# Patient Record
Sex: Male | Born: 2000 | Race: Black or African American | Hispanic: No | Marital: Single
Health system: Southern US, Community
[De-identification: ages and names within clinical notes are randomized; demographics above are authoritative.]

---

## 2020-01-03 ENCOUNTER — Other Ambulatory Visit: Payer: Self-pay

## 2020-01-03 ENCOUNTER — Emergency Department
Admission: EM | Admit: 2020-01-03 | Discharge: 2020-01-03 | Disposition: A | Discharge status: Home / Self Care | Payer: Medicaid Other | Attending: Emergency Medicine | Admitting: Emergency Medicine

## 2020-01-03 ENCOUNTER — Emergency Department: Payer: Medicaid Other

## 2020-01-03 DIAGNOSIS — Z20822 Contact with and (suspected) exposure to covid-19: Secondary | ICD-10-CM | POA: Insufficient documentation

## 2020-01-03 DIAGNOSIS — J189 Pneumonia, unspecified organism: Secondary | ICD-10-CM

## 2020-01-03 DIAGNOSIS — J181 Lobar pneumonia, unspecified organism: Secondary | ICD-10-CM | POA: Insufficient documentation

## 2020-01-03 DIAGNOSIS — R059 Cough, unspecified: Secondary | ICD-10-CM | POA: Diagnosis present

## 2020-01-03 DIAGNOSIS — R509 Fever, unspecified: Secondary | ICD-10-CM

## 2020-01-03 LAB — RESPIRATORY PANEL BY RT PCR (FLU A&B, COVID)
Influenza A by PCR: NEGATIVE
Influenza B by PCR: NEGATIVE
SARS Coronavirus 2 by RT PCR: NEGATIVE

## 2020-01-03 MED ORDER — HYDROCOD POLST-CPM POLST ER 10-8 MG/5ML PO SUER
5.0000 mL | Freq: Once | ORAL | Status: AC
  Administered 2020-01-03: 5 mL via ORAL
  Filled 2020-01-03: qty 5

## 2020-01-03 MED ORDER — AZITHROMYCIN 500 MG PO TABS
500.0000 mg | ORAL_TABLET | Freq: Once | ORAL | Status: AC
  Administered 2020-01-03: 500 mg via ORAL
  Filled 2020-01-03: qty 1

## 2020-01-03 MED ORDER — AMOXICILLIN 500 MG PO CAPS: 500.0000 mg | ORAL_CAPSULE | Freq: Three times a day (TID) | ORAL | 0 refills | Status: AC

## 2020-01-03 MED ORDER — AMOXICILLIN 500 MG PO CAPS
500.0000 mg | ORAL_CAPSULE | Freq: Once | ORAL | Status: AC
  Administered 2020-01-03: 500 mg via ORAL
  Filled 2020-01-03: qty 1

## 2020-01-03 MED ORDER — ACETAMINOPHEN 325 MG PO TABS
ORAL_TABLET | ORAL | Status: AC
  Filled 2020-01-03: qty 2

## 2020-01-03 MED ORDER — ACETAMINOPHEN 325 MG PO TABS
650.0000 mg | ORAL_TABLET | Freq: Once | ORAL | Status: AC
  Administered 2020-01-03: 650 mg via ORAL

## 2020-01-03 MED ORDER — HYDROCOD POLST-CPM POLST ER 10-8 MG/5ML PO SUER: 5.0000 mL | Freq: Two times a day (BID) | ORAL | 0 refills | Status: AC

## 2020-01-03 MED ORDER — AZITHROMYCIN 250 MG PO TABS: 250.0000 mg | ORAL_TABLET | Freq: Every day | ORAL | 0 refills | Status: AC

## 2020-01-03 NOTE — ED Provider Notes (Signed)
[facility] Emergency Department Provider Note   ____________________________________________   None    (approximate)  I have reviewed the triage vital signs and the nursing notes.   HISTORY  Chief Complaint Cough and Fever    HPI David Downs is a 19 y.o. male who presents to the ED from home with a chief complaint of cough, fever and congestion.  Symptoms of nonproductive cough and congestion x2 weeks; fever started 4 days ago.  Associated with generalized malaise.  Denies chest pain, shortness of breath, abdominal pain, nausea, vomiting or diarrhea.     Past medical history None  There are no problems to display for this patient.   Prior to Admission medications   Medication Sig Start Date End Date Taking? Authorizing Provider  amoxicillin (AMOXIL) 500 MG capsule Take 1 capsule (500 mg total) by mouth 3 (three) times daily. 01/03/20   Irean Hong, MD  azithromycin (ZITHROMAX) 250 MG tablet Take 1 tablet (250 mg total) by mouth daily. 01/03/20   Irean Hong, MD  chlorpheniramine-HYDROcodone (TUSSIONEX PENNKINETIC ER) 10-8 MG/5ML SUER Take 5 mLs by mouth 2 (two) times daily. 01/03/20   Irean Hong, MD    Allergies Patient has no allergy information on record.  No family history on file.  Social History Social History   Tobacco Use  . Smoking status: Not on file  Substance Use Topics  . Alcohol use: Not on file  . Drug use: Not on file    Review of Systems  Constitutional: Positive for fever and malaise Eyes: No visual changes. ENT: Positive for congestion.  No sore throat. Cardiovascular: Denies chest pain. Respiratory: Positive for nonproductive cough.  Denies shortness of breath. Gastrointestinal: No abdominal pain.  No nausea, no vomiting.  No diarrhea.  No constipation. Genitourinary: Negative for dysuria. Musculoskeletal: Negative for back pain. Skin: Negative for rash. Neurological: Negative for headaches, focal weakness  or numbness.   ____________________________________________   PHYSICAL EXAM:  VITAL SIGNS: ED Triage Vitals [01/03/20 0055]  Enc Vitals Group     BP 133/65     Pulse Rate 99     Resp 18     Temp (!) 103.3 F (39.6 C)     Temp Source Oral     SpO2 99 %     Weight 160 lb (72.6 kg)     Height 6\' 1"  (1.854 m)     Head Circumference      Peak Flow      Pain Score 7     Pain Loc      Pain Edu?      Excl. in GC?     Constitutional: Alert and oriented. Well appearing and in mild acute distress. Eyes: Conjunctivae are normal. PERRL. EOMI. Head: Atraumatic. Nose: Congestion/rhinnorhea. Mouth/Throat: Mucous membranes are moist.   Neck: No stridor.   Cardiovascular: Normal rate, regular rhythm. Grossly normal heart sounds.  Good peripheral circulation. Respiratory: Normal respiratory effort.  No retractions. Lungs with slight crackles right lower lobe. Gastrointestinal: Soft and nontender. No distention. No abdominal bruits. No CVA tenderness. Musculoskeletal: No lower extremity tenderness nor edema.  No joint effusions. Neurologic:  Normal speech and language. No gross focal neurologic deficits are appreciated. No gait instability. Skin:  Skin is warm, dry and intact. No rash noted.  No petechiae. Psychiatric: Mood and affect are normal. Speech and behavior are normal.  ____________________________________________   LABS (all labs ordered are listed, but only abnormal results are displayed)  Labs  Reviewed  RESPIRATORY PANEL BY RT PCR (FLU A&B, COVID)   ____________________________________________  EKG  None ____________________________________________  RADIOLOGY I, Maymie Brunke J, personally viewed and evaluated these images (plain radiographs) as part of my medical decision making, as well as reviewing the written report by the radiologist.  ED MD interpretation: RLL pneumonia  Official radiology report(s): DG Chest 2 View  Result Date: 01/03/2020 CLINICAL DATA:   Cough EXAM: CHEST - 2 VIEW COMPARISON:  None. FINDINGS: There is focal consolidation within the posterior basal right lower lobe, likely infectious in the acute setting. Minimal infiltrate may be present within the posterior basal left lower lobe. No pneumothorax or pleural effusion. Cardiac size within normal limits. Pulmonary vascularity is normal. No acute bone abnormality. IMPRESSION: Right lower lobe pneumonia. Electronically Signed   By: Helyn Numbers MD   On: 01/03/2020 01:26    ____________________________________________   PROCEDURES  Procedure(s) performed (including Critical Care):  Procedures   ____________________________________________   INITIAL IMPRESSION / ASSESSMENT AND PLAN / ED COURSE  As part of my medical decision making, I reviewed the following data within the electronic MEDICAL RECORD NUMBER Nursing notes reviewed and incorporated, Labs reviewed, Old chart reviewed (none available), Radiograph reviewed and Notes from prior ED visits (none available)     19 year old otherwise healthy male presenting with fever, cough and malaise.  Respiratory panel negative.  Chest x-ray demonstrates right lower lobe pneumonia.  Will place on amoxicillin, azithromycin and Tussionex as needed for cough.  Repeat oral temperature after Tylenol administration is 99.1 F.  Strict return precautions given.  Patient verbalizes understanding and agrees with plan of care.      ____________________________________________   FINAL CLINICAL IMPRESSION(S) / ED DIAGNOSES  Final diagnoses:  Fever, unspecified fever cause  Community acquired pneumonia of right lower lobe of lung     ED Discharge Orders         Ordered    amoxicillin (AMOXIL) 500 MG capsule  3 times daily        01/03/20 0258    azithromycin (ZITHROMAX) 250 MG tablet  Daily        01/03/20 0258    chlorpheniramine-HYDROcodone (TUSSIONEX PENNKINETIC ER) 10-8 MG/5ML SUER  2 times daily        01/03/20 0258           *Please note:  [name] was evaluated in Emergency Department on 01/03/2020 for the symptoms described in the history of present illness. He was evaluated in the context of the global COVID-19 pandemic, which necessitated consideration that the patient might be at risk for infection with the SARS-CoV-2 virus that causes COVID-19. Institutional protocols and algorithms that pertain to the evaluation of patients at risk for COVID-19 are in a state of rapid change based on information released by regulatory bodies including the CDC and federal and state organizations. These policies and algorithms were followed during the patient's care in the ED.  Some ED evaluations and interventions may be delayed as a result of limited staffing during and the pandemic.*   Note:  This document was prepared using Dragon voice recognition software and may include unintentional dictation errors.   Irean Hong, MD 01/03/20 [id number]

## 2020-01-03 NOTE — Discharge Instructions (Signed)
1.  Alternate Tylenol and ibuprofen every 4 hours as needed for fever greater than 100.4 F. 2.  Take antibiotics as prescribed: Amoxicillin 500 mg 3 times daily x7 days Azithromycin 250 mg daily x4 days (start your next dose Saturday morning) 3.  Return to the ER for worsening symptoms, persistent vomiting, difficulty breathing or other concerns.

## 2020-01-03 NOTE — ED Triage Notes (Signed)
Pt in with co cough and congestion for 2 weeks. States fever started 4 days, has been sweating, and general malaise.

## 2021-04-22 IMAGING — CR DG CHEST 2V
1 series · 2 of 2 positions shown · non-contrast
Comparison: None.

CLINICAL DATA: Cough

EXAM:
CHEST - 2 VIEW

[Series 1: dg chest 2 view · 0.14mm/px · 2 of 2 slices shown]
[im 1/2]
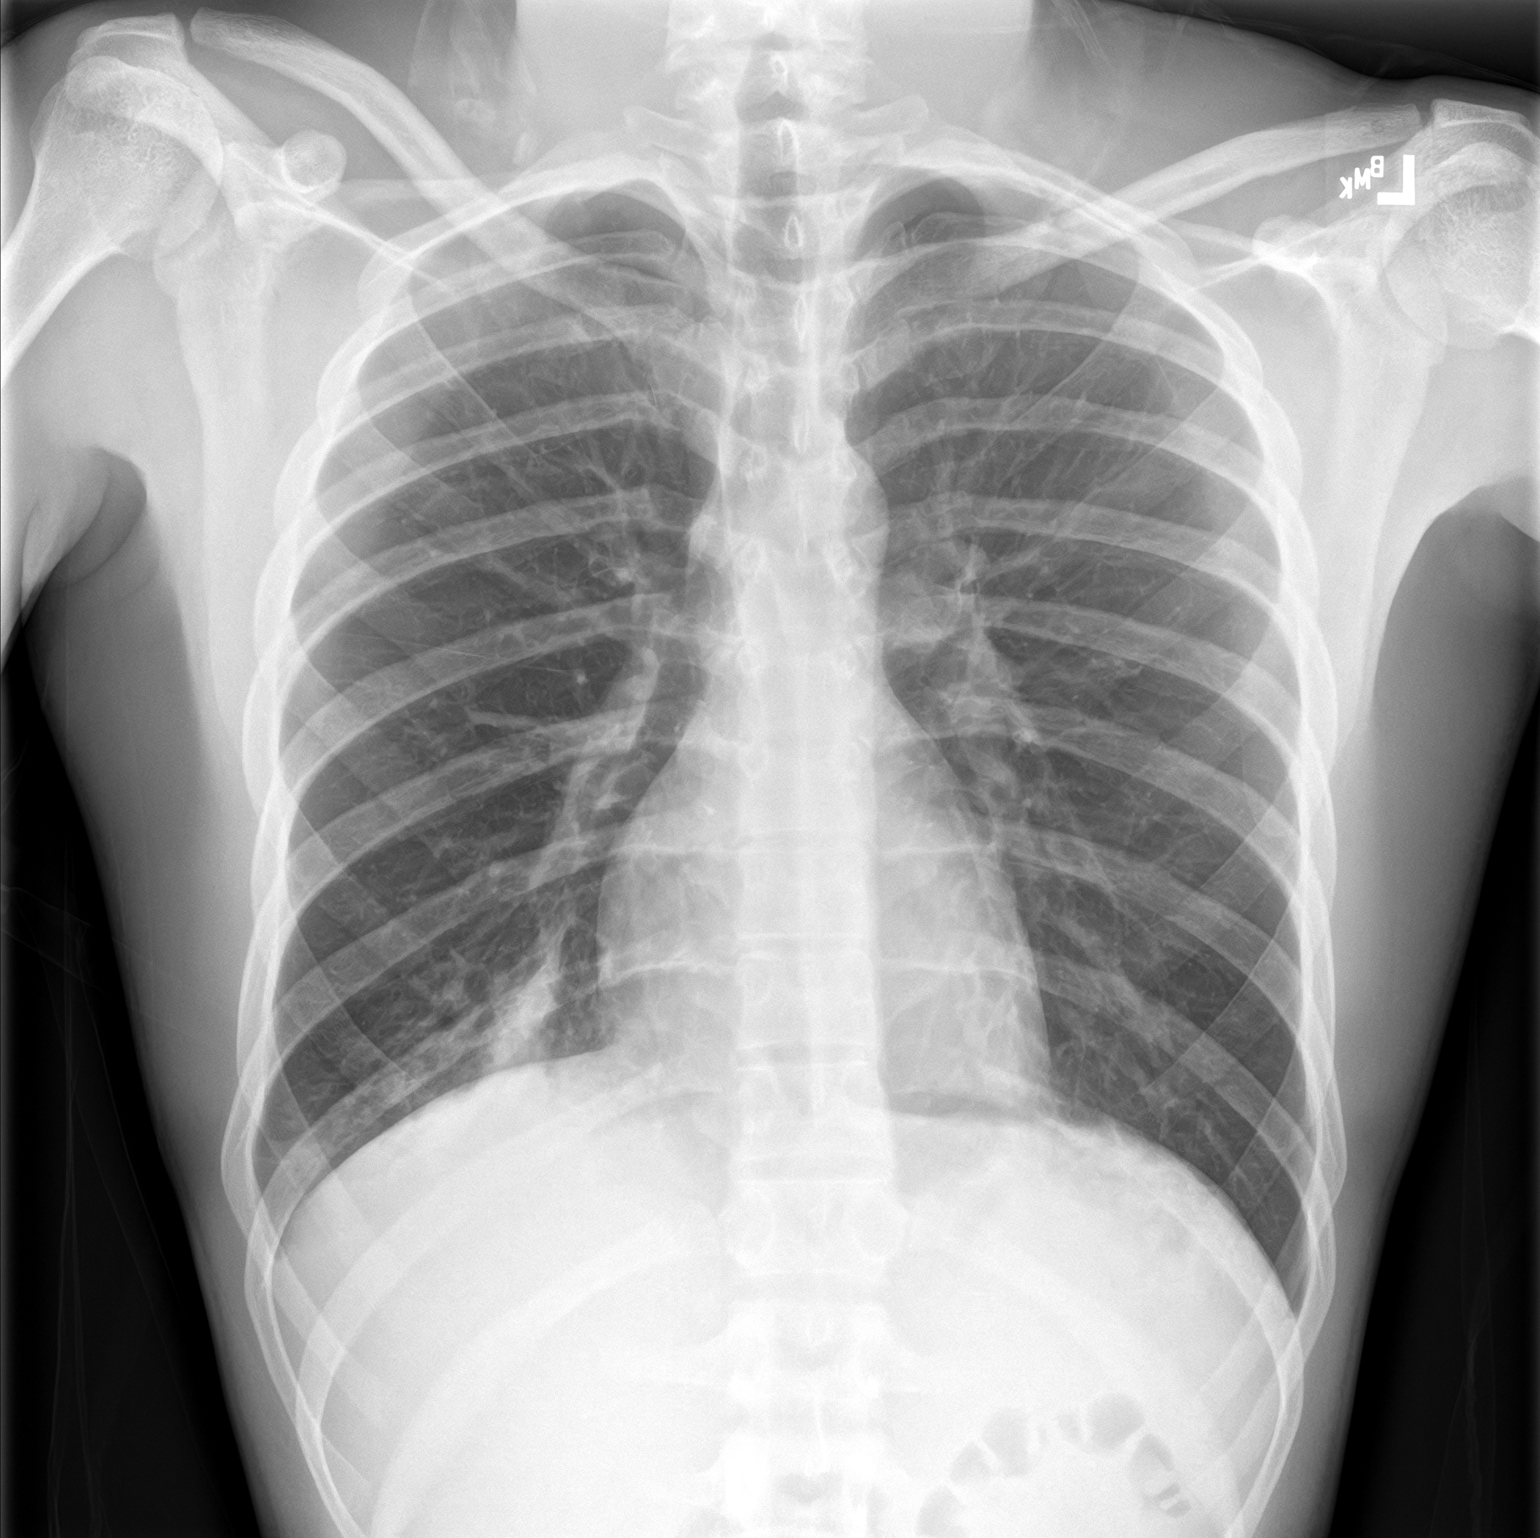
[im 2/2]
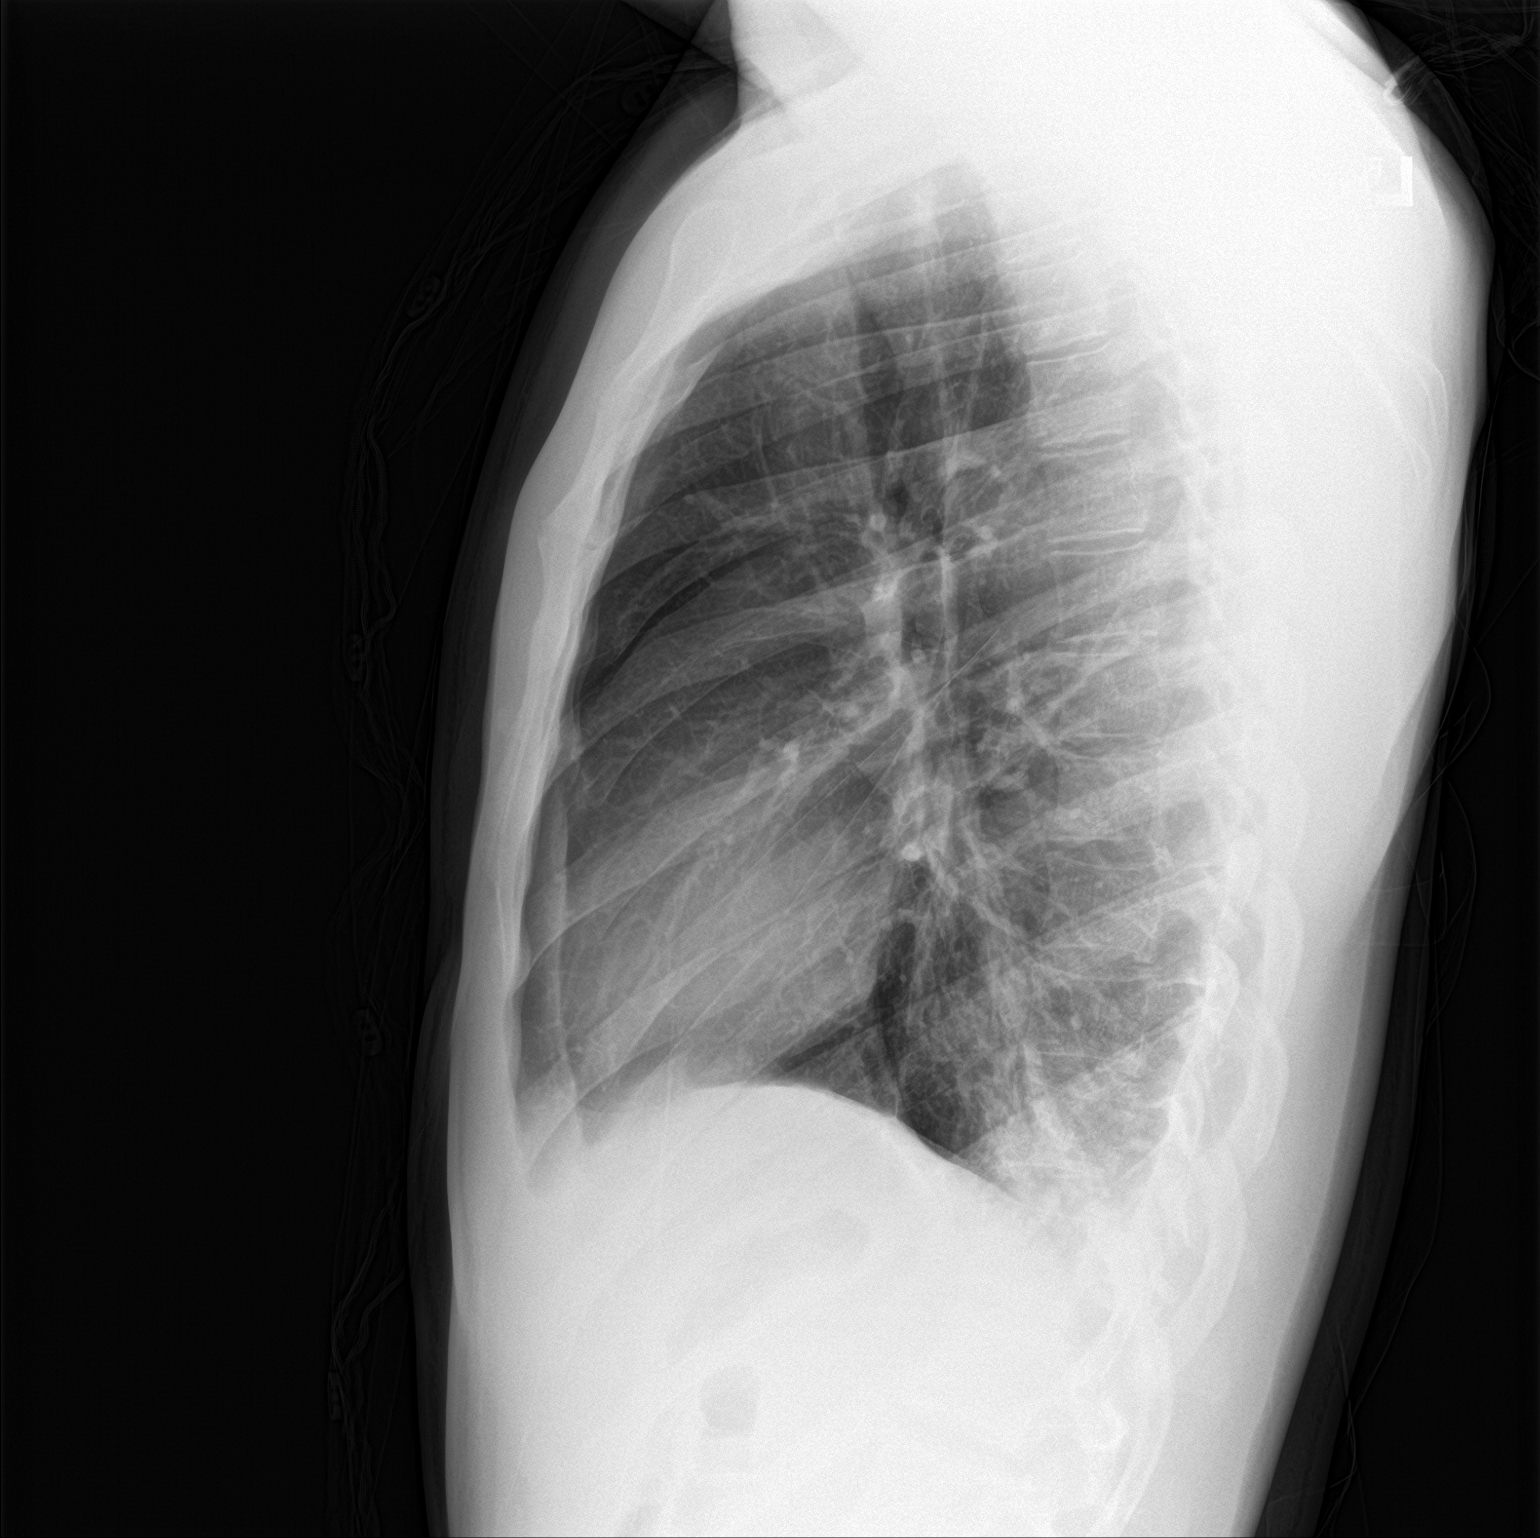

[2 of 2 positions shown; findings below may reference images not displayed]

FINDINGS: There is focal consolidation within the posterior basal right lower
lobe, likely infectious in the acute setting. Minimal infiltrate may
be present within the posterior basal left lower lobe. No
pneumothorax or pleural effusion. Cardiac size within normal limits.
Pulmonary vascularity is normal. No acute bone abnormality.
IMPRESSION: Right lower lobe pneumonia.
